# Patient Record
Sex: Male | Born: 1990 | Race: Black or African American | Hispanic: No | Marital: Single | Smoking: Current every day smoker
Health system: Southern US, Community
[De-identification: ages and names within clinical notes are randomized; demographics above are authoritative.]

---

## 2009-11-10 ENCOUNTER — Emergency Department (HOSPITAL_COMMUNITY): Admission: EM | Admit: 2009-11-10 | Discharge: 2009-11-10 | Payer: Self-pay | Admitting: Emergency Medicine

## 2009-11-18 ENCOUNTER — Emergency Department (HOSPITAL_COMMUNITY): Admission: EM | Admit: 2009-11-18 | Discharge: 2009-11-18 | Payer: Self-pay | Admitting: Family Medicine

## 2010-01-09 ENCOUNTER — Emergency Department (HOSPITAL_COMMUNITY): Admission: EM | Admit: 2010-01-09 | Discharge: 2010-01-09 | Payer: Self-pay | Admitting: Emergency Medicine

## 2010-05-17 ENCOUNTER — Emergency Department (HOSPITAL_COMMUNITY)
Admission: EM | Admit: 2010-05-17 | Discharge: 2010-05-17 | Payer: Self-pay | Source: Home / Self Care | Admitting: Emergency Medicine

## 2011-09-26 ENCOUNTER — Emergency Department (HOSPITAL_COMMUNITY)
Admission: EM | Admit: 2011-09-26 | Discharge: 2011-09-26 | Disposition: A | Discharge status: Home / Self Care | Payer: Self-pay | Attending: Emergency Medicine | Admitting: Emergency Medicine

## 2011-09-26 ENCOUNTER — Emergency Department (HOSPITAL_COMMUNITY): Admission: EM | Admit: 2011-09-26 | Payer: Self-pay | Source: Home / Self Care

## 2011-09-26 ENCOUNTER — Encounter (HOSPITAL_COMMUNITY): Payer: Self-pay | Admitting: *Deleted

## 2011-09-26 DIAGNOSIS — M2550 Pain in unspecified joint: Secondary | ICD-10-CM | POA: Insufficient documentation

## 2011-09-26 DIAGNOSIS — M79609 Pain in unspecified limb: Secondary | ICD-10-CM | POA: Insufficient documentation

## 2011-09-26 DIAGNOSIS — R6884 Jaw pain: Secondary | ICD-10-CM | POA: Insufficient documentation

## 2011-09-26 DIAGNOSIS — K089 Disorder of teeth and supporting structures, unspecified: Secondary | ICD-10-CM | POA: Insufficient documentation

## 2011-09-26 DIAGNOSIS — K0381 Cracked tooth: Secondary | ICD-10-CM | POA: Insufficient documentation

## 2011-09-26 DIAGNOSIS — K0889 Other specified disorders of teeth and supporting structures: Secondary | ICD-10-CM

## 2011-09-26 DIAGNOSIS — M79676 Pain in unspecified toe(s): Secondary | ICD-10-CM

## 2011-09-26 DIAGNOSIS — K029 Dental caries, unspecified: Secondary | ICD-10-CM | POA: Insufficient documentation

## 2011-09-26 MED ORDER — NAPROXEN 500 MG PO TABS: 500.0000 mg | ORAL_TABLET | Freq: Two times a day (BID) | ORAL | Status: DC

## 2011-09-26 NOTE — ED Provider Notes (Signed)
History     CSN: [id number]  Arrival date & time 09/26/11  1517   First MD Initiated Contact with Patient 09/26/11 1557      No chief complaint on file.   (Consider location/radiation/quality/duration/timing/severity/associated sxs/prior treatment) HPI Comments: Patient presents with 2 complaints. The patient's first complaint is that the fourth toe on his right foot is painful when he is wearing shoes. When he works his toe hurts. When he takes his shoes off the toe is not painful. Patient also complains of recurrent right mandibular jaw pain. Patient has some broken teeth and has had worsening pain in his second molar since last night. Tooth hurts mostly at night and when he drinks cold liquids. No fevers or trouble breathing.  Patient is a 21 y.o. male presenting with tooth pain. The history is provided by the patient.  Dental PainPrimary symptoms do not include dental injury, headaches, fever, shortness of breath, sore throat or cough. The symptoms began yesterday. The symptoms are unchanged. The symptoms are recurrent.  Additional symptoms do not include: gum swelling, gum tenderness, jaw pain, facial swelling, trouble swallowing and ear pain.    No past medical history on file.  No past surgical history on file.  No family history on file.  History  Substance Use Topics  . Smoking status: Not on file  . Smokeless tobacco: Not on file  . Alcohol Use: Not on file      Review of Systems  Constitutional: Negative for fever and activity change.  HENT: Positive for dental problem. Negative for ear pain, sore throat, facial swelling, trouble swallowing and neck pain.   Respiratory: Negative for cough, shortness of breath and stridor.   Musculoskeletal: Positive for arthralgias. Negative for back pain, joint swelling and gait problem.  Skin: Negative for color change and wound.  Neurological: Negative for weakness, numbness and headaches.    Allergies  Review of patient's  allergies indicates no known allergies.  Home Medications   Current Outpatient Rx  Name Route Sig Dispense Refill  . ACETAMINOPHEN 500 MG PO TABS Oral Take 500 mg by mouth every 6 (six) hours as needed. As needed for pain    . NAPROXEN 500 MG PO TABS Oral Take 1 tablet (500 mg total) by mouth 2 (two) times daily. 20 tablet 0    There were no vitals taken for this visit.  Physical Exam  Nursing note and vitals reviewed. Constitutional: He is oriented to person, place, and time. He appears well-developed and well-nourished.  HENT:  Head: Normocephalic and atraumatic. No trismus in the jaw.  Right Ear: Tympanic membrane, external ear and ear canal normal.  Left Ear: Tympanic membrane, external ear and ear canal normal.  Nose: Nose normal.  Mouth/Throat: Uvula is midline, oropharynx is clear and moist and mucous membranes are normal. Abnormal dentition. Dental caries present. No dental abscesses or uvula swelling. No tonsillar abscesses.         No swelling or erythema noted surrounding tooth on exam.  Eyes: Conjunctivae are normal. Pupils are equal, round, and reactive to light.  Neck: Normal range of motion. Neck supple.       No neck swelling or Lugwig's angina  Cardiovascular: Normal pulses.   Musculoskeletal: He exhibits tenderness. He exhibits no edema.       No tenderness or abnormality of toes on right foot. Toes are close together per patient's anatomy. No cellulitis or fungal infection noted. Normal cap refill.  Neurological: He is alert and oriented to  person, place, and time. No sensory deficit.       Motor, sensation, and vascular distal to the injury is fully intact.   Skin: Skin is warm and dry.  Psychiatric: He has a normal mood and affect.    ED Course  Procedures (including critical care time)  Labs Reviewed - No data to display No results found.   1. Pain, dental   2. Toe pain    4:22 PM Patient seen and examined.   4:23 PM Patient counseled to take  prescribed medications as directed, return with worsening facial or neck swelling, and to follow-up with his dentist as soon as possible.   Patient urged to find shoes that have a wider toe box and rare. He can use anti-inflammatories as needed for pain.   Patient verbalizes understanding and agrees with plan.  MDM  Patient with toothache.  No gross abscess.  Exam unconcerning for Ludwig's angina or other deep tissue infection in neck.  Will treat with NSAIDs.  Urged patient to follow-up with dentist. Toe pain to be treated with better fitting shoes and NSAIDs.           Eustace Moore [place], [place] 09/26/11 1624

## 2011-09-26 NOTE — Discharge Instructions (Signed)
Read and follow directions below.  As we discussed, you need to see a dentist as soon as possible for this problem.  Take naprosyn as directed with food.   If using the prescribed pain medication, do not use over-the-counter Tylenol or acetaminophen.  Do not drive, drink alcohol, or do any dangerous activities while taking the pain medicine that was prescribed.       Please return if symptoms worsen, you develop a fever, you develop more swelling in your face or neck, you have trouble breathing or swallowing food, or you feel it is necessary for any reason.    For your toe pain, you need to wear shoes that are wider in the area where your toes are.   The prescribed pain medication also should help.  RESOURCE GUIDE  Dental Problems  Patients with Medicaid: [facility] [phone] [address]                                           [phone] [address] Phone:  [phone]                                                  Phone:  [phone]  If unable to pay or uninsured, contact:  [facility] or [facility]. to become qualified for the adult dental clinic.  Chronic Pain Problems Contact [facility]  [phone] Patients need to be referred by their primary care doctor.  Insufficient Money for Medicine Contact [employer] Way:  call "211" or [facility] [phone].  No Primary Care Doctor Call [facility]  [phone] Other agencies that provide inexpensive medical care    [facility]  [phone]    [facility]  [phone]    [facility]  [phone]    [facility]  [phone]    [facility]  [phone]    [facility]  [phone]  Psychological Services [facility]  [phone] [facility] Services  [phone] [facility] Mental Health   [phone] (emergency services [phone])  Substance Abuse Resources [facility]   [phone] [facility] [phone] [facility] [place] [phone] Floydene Flock [phone] Residential & Outpatient Substance Abuse Program  [phone]  Abuse/Neglect [facility] Child Abuse Hotline 803-553-2424 [facility] Child Abuse Hotline 318-819-3981 (After Hours)  Emergency Shelter [facility] Ministries [phone]  Maternity Homes Room at the [place] [facility] [place] [phone] [facility] [phone]  MRSA Hotline #:   8725351236    [facility] Resources  [facility]     United Way                          [facility] Dept. [address] [place]                       [address]      [address]  [address]  Sela Hua Phone:  [phone]                                   Phone:  [phone]                 Phone:  [place] Phone:  [place] [phone] (After Hours)

## 2011-09-26 NOTE — ED Provider Notes (Signed)
Medical screening examination/treatment/procedure(s) were performed by non-physician practitioner and as supervising physician I was immediately available for consultation/collaboration.  Toy Baker, MD 09/26/11 2308

## 2011-09-26 NOTE — ED Notes (Signed)
Pt reports R lower toothache and R foot pain.  Denies injury to R foot.  Pt reports R foot has been hurting him x 2 months.

## 2011-11-30 ENCOUNTER — Encounter (HOSPITAL_COMMUNITY): Payer: Self-pay | Admitting: Family Medicine

## 2011-11-30 ENCOUNTER — Emergency Department (HOSPITAL_COMMUNITY)
Admission: EM | Admit: 2011-11-30 | Discharge: 2011-12-01 | Disposition: A | Discharge status: Home / Self Care | Payer: Managed Care, Other (non HMO) | Attending: Emergency Medicine | Admitting: Emergency Medicine

## 2011-11-30 ENCOUNTER — Emergency Department (HOSPITAL_COMMUNITY)
Admission: EM | Admit: 2011-11-30 | Discharge: 2011-11-30 | Discharge status: Against Medical Advice | Payer: Managed Care, Other (non HMO) | Attending: Emergency Medicine | Admitting: Emergency Medicine

## 2011-11-30 ENCOUNTER — Encounter (HOSPITAL_COMMUNITY): Payer: Self-pay | Admitting: *Deleted

## 2011-11-30 DIAGNOSIS — R112 Nausea with vomiting, unspecified: Secondary | ICD-10-CM | POA: Insufficient documentation

## 2011-11-30 DIAGNOSIS — R1110 Vomiting, unspecified: Secondary | ICD-10-CM | POA: Insufficient documentation

## 2011-11-30 DIAGNOSIS — B349 Viral infection, unspecified: Secondary | ICD-10-CM

## 2011-11-30 DIAGNOSIS — R42 Dizziness and giddiness: Secondary | ICD-10-CM | POA: Insufficient documentation

## 2011-11-30 DIAGNOSIS — R197 Diarrhea, unspecified: Secondary | ICD-10-CM | POA: Insufficient documentation

## 2011-11-30 DIAGNOSIS — B9789 Other viral agents as the cause of diseases classified elsewhere: Secondary | ICD-10-CM | POA: Insufficient documentation

## 2011-11-30 DIAGNOSIS — J45909 Unspecified asthma, uncomplicated: Secondary | ICD-10-CM | POA: Insufficient documentation

## 2011-11-30 NOTE — ED Notes (Signed)
Patient states he has n/v/d and dizziness since Monday. Also reports decreased appetite. Last vomited around noon. Last ate 30 minutes prior to arrival and has kept that down. Able to keep liquids down.

## 2011-12-01 LAB — URINALYSIS, ROUTINE W REFLEX MICROSCOPIC
Bilirubin Urine: NEGATIVE
Leukocytes, UA: NEGATIVE
Nitrite: NEGATIVE
Protein, ur: NEGATIVE mg/dL
pH: 7 (ref 5.0–8.0)

## 2011-12-01 LAB — POCT I-STAT, CHEM 8
Creatinine, Ser: 1 mg/dL (ref 0.50–1.35)
Hemoglobin: 15 g/dL (ref 13.0–17.0)
Potassium: 3.8 mEq/L (ref 3.5–5.1)
Sodium: 140 mEq/L (ref 135–145)
TCO2: 24 mmol/L (ref 0–100)

## 2011-12-01 MED ORDER — ONDANSETRON HCL 4 MG/2ML IJ SOLN
4.0000 mg | Freq: Once | INTRAMUSCULAR | Status: AC
  Administered 2011-12-01: 4 mg via INTRAVENOUS
  Filled 2011-12-01: qty 2

## 2011-12-01 MED ORDER — SODIUM CHLORIDE 0.9 % IV BOLUS (SEPSIS)
1000.0000 mL | Freq: Once | INTRAVENOUS | Status: AC
  Administered 2011-12-01: 1000 mL via INTRAVENOUS

## 2011-12-01 MED ORDER — ONDANSETRON 8 MG PO TBDP: ORAL_TABLET | ORAL | Status: AC

## 2011-12-01 NOTE — Discharge Instructions (Signed)
Continue to get plenty of rest. Drink plenty of fluids to stay hydrated. Use Tylenol or ibuprofen for body aches and fever. Return to the emergency room if you have any worsening symptoms or persistent nausea vomiting. Please followup with the primary care provider.   Viral Syndrome You or your child has Viral Syndrome. It is the most common infection causing "colds" and infections in the nose, throat, sinuses, and breathing tubes. Sometimes the infection causes nausea, vomiting, or diarrhea. The germ that causes the infection is a virus. No antibiotic or other medicine will kill it. There are medicines that you can take to make you or your child more comfortable.  HOME CARE INSTRUCTIONS   Rest in bed until you start to feel better.   If you have diarrhea or vomiting, eat small amounts of crackers and toast. Soup is helpful.   Do not give aspirin or medicine that contains aspirin to children.   Only take over-the-counter or prescription medicines for pain, discomfort, or fever as directed by your caregiver.  SEEK IMMEDIATE MEDICAL CARE IF:   You or your child has not improved within one week.   You or your child has pain that is not at least partially relieved by over-the-counter medicine.   Thick, colored mucus or blood is coughed up.   Discharge from the nose becomes thick yellow or green.   Diarrhea or vomiting gets worse.   There is any major change in your or your child's condition.   You or your child develops a skin rash, stiff neck, severe headache, or are unable to hold down food or fluid.   You or your child has an oral temperature above 102 F (38.9 C), not controlled by medicine.   Your baby is older than 3 months with a rectal temperature of 102 F (38.9 C) or higher.   Your baby is 23 months old or younger with a rectal temperature of 100.4 F (38 C) or higher.  Document Released: 07/10/2006 Document Revised: 07/14/2011 Document Reviewed: 07/11/2007 [facility]  Patient Information 2012 [place], [place].    RESOURCE GUIDE  Dental Problems  Patients with Medicaid: [facility] [phone] [address]                                           [phone] [address] Phone:  [phone]                                                  Phone:  [phone]  If unable to pay or uninsured, contact:  [facility] or [facility]. to become qualified for the adult dental clinic.  Chronic Pain Problems Contact [facility]  [phone] Patients need to be referred by their primary care doctor.  Insufficient Money for Medicine Contact United Way:  call "211" or [facility] [phone].  No Primary Care Doctor Call [facility]  [phone] Other agencies that provide inexpensive medical care    [facility]  [phone]    [facility]  [phone]    [facility]  [phone]    Women's Clinic  [phone]    [facility]  [phone]    [facility]  [phone]  Psychological Services [facility] Behavioral Health  [phone] [facility]  [phone] [facility] Mental Health   [phone] (emergency services [phone])  Substance Abuse Resources Alcohol and Drug Services  [phone] [facility] [phone] [facility] [place] [phone] Floydene Flock [phone] Residential & Outpatient Substance Abuse Program  [phone]  Abuse/Neglect [facility] Child Abuse Hotline 6360228086 [facility] Child Abuse Hotline (743)150-1175 (After Hours)  Emergency Shelter [facility] Ministries [phone]  Maternity Homes Room at the [place] [facility] [place] [phone] [facility] [phone]  MRSA Hotline #:   323 647 9535    [facility] Resources  [facility] [place]     [employer]                          [facility] Dept. [address]  [place]                       [address]      [address]  Blondell Reveal Phone:  [phone]                                   Phone:  [phone]                 Phone:  [phone]  [facility] Mental Health Phone:  [phone]  [facility] Child Abuse Hotline 825-682-9018 [phone] (After Hours)

## 2011-12-01 NOTE — ED Provider Notes (Signed)
Medical screening examination/treatment/procedure(s) were performed by non-physician practitioner and as supervising physician I was immediately available for consultation/collaboration.   Forbes Cellar, MD 12/01/11 [phone]

## 2011-12-01 NOTE — ED Notes (Signed)
Pt denies any nausea complaints

## 2011-12-01 NOTE — ED Provider Notes (Signed)
History     CSN: [id number]  Arrival date & time 11/30/11  2159   First MD Initiated Contact with Patient 12/01/11 0046      Chief Complaint  Patient presents with  . Dizziness  . Emesis  . Diarrhea    HPI  History provided by the patient. Patient is a healthy 21 year old male with history of asthma presents with complaints of nausea vomiting and diarrhea for the past 2-3 days. Symptoms began acutely and have been waxing and waning. Patient reports having up to 4 episodes of soft watery stools without blood or mucus today. Patient also has occasional nausea vomiting that seems worse with trying to keep foods. Patient has been able to keep down fluids. Symptoms are described as moderate. Symptoms are also associated with some lightheadedness with standing, bodyaches and chills. Patient denies any fever. Patient denies any syncope. Patient is unsure of any known sick contacts. Patient denies any other aggravating or alleviating factors.     Past Medical History  Diagnosis Date  . Asthma     History reviewed. No pertinent past surgical history.  No family history on file.  History  Substance Use Topics  . Smoking status: Current Everyday Smoker -- 0.5 packs/day    Types: Cigars  . Smokeless tobacco: Not on file  . Alcohol Use: Yes     Occasional      Review of Systems  Constitutional: Positive for chills and appetite change. Negative for fever.  HENT: Negative for congestion, sore throat and rhinorrhea.   Respiratory: Positive for cough. Negative for shortness of breath and wheezing.   Gastrointestinal: Positive for nausea, vomiting and diarrhea. Negative for abdominal pain and constipation.  Genitourinary: Negative for dysuria, frequency, hematuria and flank pain.  Musculoskeletal: Positive for myalgias.  Skin: Negative for rash.    Allergies  Augmentin and Penicillins  Home Medications   Current Outpatient Rx  Name Route Sig Dispense Refill  . ACETAMINOPHEN  500 MG PO TABS Oral Take 1,000 mg by mouth every 6 (six) hours as needed. Pain relief    . DIPHENHYDRAMINE HCL 25 MG PO TABS Oral Take 50 mg by mouth every 6 (six) hours as needed. For allergies.      BP 120/70  Pulse 68  Temp 98.3 F (36.8 C)  Resp 20  Wt 142 lb 8 oz (64.638 kg)  SpO2 100%  Physical Exam  Nursing note and vitals reviewed. Constitutional: He is oriented to person, place, and time. He appears well-developed and well-nourished. No distress.  HENT:  Head: Normocephalic and atraumatic.  Mouth/Throat: Oropharynx is clear and moist.  Neck: Normal range of motion. Neck supple.       No meningeal sign  Cardiovascular: Normal rate and regular rhythm.   Pulmonary/Chest: Effort normal. No respiratory distress. He has wheezes. He has no rales.  Abdominal: Soft. He exhibits no distension. There is no tenderness.  Neurological: He is alert and oriented to person, place, and time.  Skin: Skin is warm.  Psychiatric: He has a normal mood and affect. His behavior is normal.    ED Course  Procedures   Results for orders placed during the hospital encounter of 11/30/11  URINALYSIS, ROUTINE W REFLEX MICROSCOPIC      Component Value Range   Color, Urine YELLOW  YELLOW    APPearance CLOUDY (*) CLEAR    Specific Gravity, Urine 1.027  1.005 - 1.030    pH 7.0  5.0 - 8.0    Glucose, UA NEGATIVE  NEGATIVE (mg/dL)   Hgb urine dipstick NEGATIVE  NEGATIVE    Bilirubin Urine NEGATIVE  NEGATIVE    Ketones, ur TRACE (*) NEGATIVE (mg/dL)   Protein, ur NEGATIVE  NEGATIVE (mg/dL)   Urobilinogen, UA 1.0  0.0 - 1.0 (mg/dL)   Nitrite NEGATIVE  NEGATIVE    Leukocytes, UA NEGATIVE  NEGATIVE   POCT I-STAT, CHEM 8      Component Value Range   Sodium 140  135 - 145 (mEq/L)   Potassium 3.8  3.5 - 5.1 (mEq/L)   Chloride 104  96 - 112 (mEq/L)   BUN 10  6 - 23 (mg/dL)   Creatinine, Ser 1.61  0.50 - 1.35 (mg/dL)   Glucose, Bld 096 (*) 70 - 99 (mg/dL)   Calcium, Ion 0.45  4.09 - 1.32 (mmol/L)    TCO2 24  0 - 100 (mmol/L)   Hemoglobin 15.0  13.0 - 17.0 (g/dL)   HCT 81.1  91.4 - 78.2 (%)       1. Viral syndrome   2. Nausea vomiting and diarrhea       MDM  Patient seen and evaluated. Patient no acute distress. Patient is well-appearing and nontoxic.  Patient feeling better after IV fluids and Zofran. Patient is ready return home. Labs unremarkable.      Angus Seller, [place] 12/01/11 [phone]

## 2012-07-19 ENCOUNTER — Encounter (HOSPITAL_COMMUNITY): Payer: Self-pay | Admitting: Emergency Medicine

## 2012-07-19 ENCOUNTER — Emergency Department (HOSPITAL_COMMUNITY)
Admission: EM | Admit: 2012-07-19 | Discharge: 2012-07-19 | Disposition: A | Discharge status: Home / Self Care | Payer: Managed Care, Other (non HMO) | Attending: Emergency Medicine | Admitting: Emergency Medicine

## 2012-07-19 DIAGNOSIS — J45909 Unspecified asthma, uncomplicated: Secondary | ICD-10-CM | POA: Insufficient documentation

## 2012-07-19 DIAGNOSIS — Y929 Unspecified place or not applicable: Secondary | ICD-10-CM | POA: Insufficient documentation

## 2012-07-19 DIAGNOSIS — Y939 Activity, unspecified: Secondary | ICD-10-CM | POA: Insufficient documentation

## 2012-07-19 DIAGNOSIS — X58XXXA Exposure to other specified factors, initial encounter: Secondary | ICD-10-CM | POA: Insufficient documentation

## 2012-07-19 DIAGNOSIS — T148XXA Other injury of unspecified body region, initial encounter: Secondary | ICD-10-CM

## 2012-07-19 DIAGNOSIS — F17200 Nicotine dependence, unspecified, uncomplicated: Secondary | ICD-10-CM | POA: Insufficient documentation

## 2012-07-19 DIAGNOSIS — S239XXA Sprain of unspecified parts of thorax, initial encounter: Secondary | ICD-10-CM | POA: Insufficient documentation

## 2012-07-19 DIAGNOSIS — M549 Dorsalgia, unspecified: Secondary | ICD-10-CM

## 2012-07-19 DIAGNOSIS — M546 Pain in thoracic spine: Secondary | ICD-10-CM | POA: Insufficient documentation

## 2012-07-19 MED ORDER — IBUPROFEN 800 MG PO TABS
800.0000 mg | ORAL_TABLET | Freq: Once | ORAL | Status: AC
  Administered 2012-07-19: 800 mg via ORAL
  Filled 2012-07-19: qty 1

## 2012-07-19 MED ORDER — CYCLOBENZAPRINE HCL 10 MG PO TABS
10.0000 mg | ORAL_TABLET | Freq: Once | ORAL | Status: AC
  Administered 2012-07-19: 10 mg via ORAL
  Filled 2012-07-19: qty 1

## 2012-07-19 MED ORDER — HYDROCODONE-ACETAMINOPHEN 5-325 MG PO TABS: 1.0000 | ORAL_TABLET | ORAL | Status: AC | PRN

## 2012-07-19 MED ORDER — CYCLOBENZAPRINE HCL 10 MG PO TABS: 10.0000 mg | ORAL_TABLET | Freq: Two times a day (BID) | ORAL | Status: AC | PRN

## 2012-07-19 MED ORDER — IBUPROFEN 800 MG PO TABS: 800.0000 mg | ORAL_TABLET | Freq: Three times a day (TID) | ORAL | Status: AC

## 2012-07-19 NOTE — ED Notes (Signed)
Patient c/o upper and mid back pain since last Wednesday.  Patient hasn't been able to sleep.  Movement makes pain worse.  No other associated symptoms reported.

## 2012-07-19 NOTE — ED Provider Notes (Signed)
History     CSN: [id number]  Arrival date & time 07/19/12  1317   First MD Initiated Contact with Patient 07/19/12 1409      Chief Complaint  Patient presents with  . Back Pain    (Consider location/radiation/quality/duration/timing/severity/associated sxs/prior treatment) Patient is a 21 y.o. male presenting with back pain. The history is provided by the patient.  Back Pain  This is a new problem. The current episode started more than 1 week ago. The problem occurs constantly. The problem has been gradually worsening. Associated with: He does heavy lifting at work but no known specific injury. The pain is present in the thoracic spine. The pain is moderate. The symptoms are aggravated by bending, twisting and certain positions. Pertinent negatives include no chest pain, no fever and no abdominal pain. Associated symptoms comments: No SOB, chest pain, cough or fever. His pain is worse with movement and better with rest. .    Past Medical History  Diagnosis Date  . Asthma     History reviewed. No pertinent past surgical history.  History reviewed. No pertinent family history.  History  Substance Use Topics  . Smoking status: Current Every Day Smoker -- 0.5 packs/day    Types: Cigars  . Smokeless tobacco: Not on file  . Alcohol Use: Yes     Comment: Occasional      Review of Systems  Constitutional: Negative for fever.  Respiratory: Negative for cough and shortness of breath.   Cardiovascular: Negative for chest pain.  Gastrointestinal: Negative for nausea and abdominal pain.  Musculoskeletal: Positive for back pain.  Skin: Negative for wound.    Allergies  Augmentin and Penicillins  Home Medications   Current Outpatient Rx  Name  Route  Sig  Dispense  Refill  . ACETAMINOPHEN 500 MG PO TABS   Oral   Take 1,000 mg by mouth every 6 (six) hours as needed. Pain relief         . IBUPROFEN 200 MG PO TABS   Oral   Take 800 mg by mouth every 6 (six) hours as  needed. Pain.           BP 109/70  Pulse 75  Temp 98.3 F (36.8 C) (Oral)  Resp 20  SpO2 100%  Physical Exam  Constitutional: He is oriented to person, place, and time. He appears well-developed and well-nourished.  Neck: Normal range of motion.  Pulmonary/Chest: Effort normal. He exhibits no tenderness.  Abdominal: There is no tenderness.  Musculoskeletal: Normal range of motion.       Reproducible tenderness to left upper back from paraspinal region to lateral border. No swelling or mass. FROM left upper extremity.  Neurological: He is alert and oriented to person, place, and time.  Skin: Skin is warm and dry.  Psychiatric: He has a normal mood and affect.    ED Course  Procedures (including critical care time)  Labs Reviewed - No data to display No results found.   No diagnosis found.  1. Muscular back strain  MDM  Muscular back pain        Rodena Medin, PA-C 07/19/12 1419

## 2012-07-20 NOTE — ED Provider Notes (Signed)
Medical screening examination/treatment/procedure(s) were performed by non-physician practitioner and as supervising physician I was immediately available for consultation/collaboration.  Donnetta Hutching, MD 07/20/12 [phone]

## 2012-11-08 ENCOUNTER — Other Ambulatory Visit: Payer: Self-pay | Admitting: Family Medicine

## 2012-11-08 ENCOUNTER — Ambulatory Visit
Admission: RE | Admit: 2012-11-08 | Discharge: 2012-11-08 | Disposition: A | Discharge status: Home / Self Care | Payer: Managed Care, Other (non HMO) | Source: Ambulatory Visit | Attending: Family Medicine | Admitting: Family Medicine

## 2012-11-08 DIAGNOSIS — R52 Pain, unspecified: Secondary | ICD-10-CM

## 2014-05-12 ENCOUNTER — Emergency Department (HOSPITAL_COMMUNITY)
Admission: EM | Admit: 2014-05-12 | Discharge: 2014-05-12 | Disposition: A | Discharge status: Home / Self Care | Payer: BC Managed Care – PPO | Attending: Emergency Medicine | Admitting: Emergency Medicine

## 2014-05-12 ENCOUNTER — Encounter (HOSPITAL_COMMUNITY): Payer: Self-pay | Admitting: Emergency Medicine

## 2014-05-12 ENCOUNTER — Emergency Department (HOSPITAL_COMMUNITY): Payer: BC Managed Care – PPO

## 2014-05-12 DIAGNOSIS — X58XXXA Exposure to other specified factors, initial encounter: Secondary | ICD-10-CM | POA: Diagnosis not present

## 2014-05-12 DIAGNOSIS — Z880 Allergy status to penicillin: Secondary | ICD-10-CM | POA: Diagnosis not present

## 2014-05-12 DIAGNOSIS — Z7902 Long term (current) use of antithrombotics/antiplatelets: Secondary | ICD-10-CM | POA: Insufficient documentation

## 2014-05-12 DIAGNOSIS — S20309A Unspecified superficial injuries of unspecified front wall of thorax, initial encounter: Secondary | ICD-10-CM | POA: Diagnosis not present

## 2014-05-12 DIAGNOSIS — Y9389 Activity, other specified: Secondary | ICD-10-CM | POA: Insufficient documentation

## 2014-05-12 DIAGNOSIS — R0789 Other chest pain: Secondary | ICD-10-CM

## 2014-05-12 DIAGNOSIS — Z720 Tobacco use: Secondary | ICD-10-CM | POA: Insufficient documentation

## 2014-05-12 DIAGNOSIS — Y990 Civilian activity done for income or pay: Secondary | ICD-10-CM | POA: Insufficient documentation

## 2014-05-12 DIAGNOSIS — J45909 Unspecified asthma, uncomplicated: Secondary | ICD-10-CM | POA: Insufficient documentation

## 2014-05-12 MED ORDER — NAPROXEN 500 MG PO TABS
500.0000 mg | ORAL_TABLET | Freq: Two times a day (BID) | ORAL | Status: AC
Start: 2014-05-12 — End: ?

## 2014-05-12 NOTE — ED Notes (Signed)
Pt states about 1 week ago at work was lifting something heavy and felt chest [popped since then pt has been having chest pain, denies SOB n/v.

## 2014-05-12 NOTE — Discharge Instructions (Signed)
Take the prescribed medication as directed. °Return to the ED for new or worsening symptoms. ° °

## 2014-05-12 NOTE — ED Provider Notes (Signed)
CSN: [id number]     Arrival date & time 05/12/14  1045 History   First MD Initiated Contact with Patient 05/12/14 1101     Chief Complaint  Patient presents with  . Chest Pain     (Consider location/radiation/quality/duration/timing/severity/associated sxs/prior Treatment) Patient is a 23 y.o. male presenting with chest pain. The history is provided by the patient and medical records.  Chest Pain  This is a 23 year old male with past history significant for asthma, presented to the ED for chest pain. Patient states one week ago the ED was lifting something heavy at work and felt a "pop" in his chest. States since that time he's been having ongoing chest pain localized to top of his sternum without radiation. He denies any shortness of breath, palpitations, dizziness, weakness, nausea, vomiting.  Patient has no prior cardiac history. He is a daily smoker, approximately half pack per day.  No prior hx of DVT or PE.  VS stable on arrival.  Past Medical History  Diagnosis Date  . Asthma    History reviewed. No pertinent past surgical history. No family history on file. History  Substance Use Topics  . Smoking status: Current Every Day Smoker -- 0.50 packs/day    Types: Cigars  . Smokeless tobacco: Not on file  . Alcohol Use: Yes     Comment: Occasional    Review of Systems  Cardiovascular: Positive for chest pain.  All other systems reviewed and are negative.     Allergies  Augmentin and Penicillins  Home Medications   Prior to Admission medications   Medication Sig Start Date End Date Taking? Authorizing Provider  acetaminophen (TYLENOL) 500 MG tablet Take 1,000 mg by mouth every 6 (six) hours as needed. Pain relief    Historical Provider, MD  cyclobenzaprine (FLEXERIL) 10 MG tablet Take 1 tablet (10 mg total) by mouth 2 (two) times daily as needed for muscle spasms. 07/19/12   Shari A Upstill, PA-C  HYDROcodone-acetaminophen (NORCO/VICODIN) 5-325 MG per tablet Take 1  tablet by mouth every 4 (four) hours as needed for pain. 07/19/12   Shari A Upstill, PA-C  ibuprofen (ADVIL,MOTRIN) 200 MG tablet Take 800 mg by mouth every 6 (six) hours as needed. Pain.    Historical Provider, MD  ibuprofen (ADVIL,MOTRIN) 800 MG tablet Take 1 tablet (800 mg total) by mouth 3 (three) times daily. 07/19/12   Shari A Upstill, PA-C   BP 129/75  Pulse 78  Temp(Src) 98.3 F (36.8 C) (Oral)  Resp 13  SpO2 100%  Physical Exam  Nursing note and vitals reviewed. Constitutional: He is oriented to person, place, and time. He appears well-developed and well-nourished.  HENT:  Head: Normocephalic and atraumatic.  Mouth/Throat: Oropharynx is clear and moist.  Eyes: Conjunctivae and EOM are normal. Pupils are equal, round, and reactive to light.  Neck: Normal range of motion.  Cardiovascular: Normal rate, regular rhythm and normal heart sounds.   Pulmonary/Chest: Effort normal and breath sounds normal. No respiratory distress. He has no wheezes. He exhibits tenderness and bony tenderness.    Pain reproducible with palpation to top of sternum; no deformities noted; respirations unlabored; lungs clear bilaterally  Abdominal: Soft. Bowel sounds are normal.  Musculoskeletal: Normal range of motion.  Neurological: He is alert and oriented to person, place, and time.  Skin: Skin is warm and dry.  Psychiatric: He has a normal mood and affect.    ED Course  Procedures (including critical care time) Labs Review Labs Reviewed - No data  to display  Imaging Review Dg Chest 2 View  05/12/2014   CLINICAL DATA:  Occasional smoker with new mid chest pain and burning.  EXAM: CHEST  2 VIEW  COMPARISON:  None.  FINDINGS: Trachea is midline. Heart size normal. Probable vessel on end or calcified granuloma in the left suprahilar region. Lungs are otherwise clear. No pleural fluid.  IMPRESSION: No acute findings.   Electronically Signed   By: Leanna Battles M.D.   On: 05/12/2014 11:18     EKG  Interpretation   Date/Time:  Monday May 12 2014 10:50:22 EDT Ventricular Rate:  72 PR Interval:  145 QRS Duration: 97 QT Interval:  375 QTC Calculation: 410 R Axis:   63 Text Interpretation:  Sinus rhythm RSR' in V1 or V2, probably normal  variant Baseline wander in lead(s) III aVL aVF V3 Otherwise within normal  limits Confirmed by POLLINA  MD, CHRISTOPHER [phone]) on 05/12/2014 10:56:21  AM      MDM   Final diagnoses:  None   23 year old male with chest pain after lifting heavy object of work last week. On exam, pain is reproducible with palpation to the upper sternum. His respirations are unlabored and his vital signs are stable on room air.  EKG sinus rhythm without acute ischemic change. Chest x-ray is clear, no evidence of rib fracture or pneumothorax. Low suspicion for ACS, PE, dissection, or other acute cardiac event at this time. Patient will discharge home with supportive care.  Discussed plan with patient, he/she acknowledged understanding and agreed with plan of care.  Return precautions given for new or worsening symptoms.  Garlon Hatchet, PA-C 05/12/14 1217

## 2014-05-13 NOTE — ED Provider Notes (Signed)
Medical screening examination/treatment/procedure(s) were performed by non-physician practitioner and as supervising physician I was immediately available for consultation/collaboration.   Christopher J. Pollina, MD 05/13/14 0715 

## 2015-05-15 ENCOUNTER — Emergency Department (HOSPITAL_COMMUNITY)
Admission: EM | Admit: 2015-05-15 | Discharge: 2015-05-16 | Discharge status: Against Medical Advice | Payer: BLUE CROSS/BLUE SHIELD | Attending: Emergency Medicine | Admitting: Emergency Medicine

## 2015-05-15 ENCOUNTER — Encounter (HOSPITAL_COMMUNITY): Payer: Self-pay | Admitting: Emergency Medicine

## 2015-05-15 DIAGNOSIS — J45909 Unspecified asthma, uncomplicated: Secondary | ICD-10-CM | POA: Diagnosis not present

## 2015-05-15 DIAGNOSIS — K0889 Other specified disorders of teeth and supporting structures: Secondary | ICD-10-CM

## 2015-05-15 DIAGNOSIS — Z720 Tobacco use: Secondary | ICD-10-CM | POA: Insufficient documentation

## 2015-05-15 NOTE — ED Notes (Signed)
Pt from home c/o left lower side dental pain that has been ongoing. Pt reports taking ibuprofen without relief. Pt has no dentist.

## 2015-05-15 NOTE — ED Notes (Signed)
Attempted to call pt x 3. No answer

## 2015-05-17 ENCOUNTER — Emergency Department (HOSPITAL_COMMUNITY)
Admission: EM | Admit: 2015-05-17 | Discharge: 2015-05-17 | Disposition: A | Discharge status: Home / Self Care | Payer: Managed Care, Other (non HMO) | Attending: Emergency Medicine | Admitting: Emergency Medicine

## 2015-05-17 ENCOUNTER — Encounter (HOSPITAL_COMMUNITY): Payer: Self-pay | Admitting: Emergency Medicine

## 2015-05-17 DIAGNOSIS — Z720 Tobacco use: Secondary | ICD-10-CM | POA: Insufficient documentation

## 2015-05-17 DIAGNOSIS — K029 Dental caries, unspecified: Secondary | ICD-10-CM | POA: Insufficient documentation

## 2015-05-17 DIAGNOSIS — J45909 Unspecified asthma, uncomplicated: Secondary | ICD-10-CM | POA: Insufficient documentation

## 2015-05-17 DIAGNOSIS — K002 Abnormalities of size and form of teeth: Secondary | ICD-10-CM | POA: Insufficient documentation

## 2015-05-17 DIAGNOSIS — Z791 Long term (current) use of non-steroidal anti-inflammatories (NSAID): Secondary | ICD-10-CM | POA: Insufficient documentation

## 2015-05-17 MED ORDER — CLINDAMYCIN HCL 150 MG PO CAPS: 150.0000 mg | ORAL_CAPSULE | Freq: Four times a day (QID) | ORAL | Status: AC

## 2015-05-17 NOTE — ED Notes (Signed)
Pt c/o left posterior dental pain that started on Friday.  Pt states that he has broken tooth and taken ibuprofen for pain.

## 2015-05-17 NOTE — Discharge Instructions (Signed)
Follow-up with your dentist, or with the dentist that we referred tomorrow for further management of your teeth. Continue to take ibuprofen and take antibiotic to help decrease the infection.  Dental Pain Dental pain may be caused by many things, including:  Tooth decay (cavities or caries). Cavities expose the nerve of your tooth to air and hot or cold temperatures. This can cause pain or discomfort.  Abscess or infection. A dental abscess is a collection of infected pus from a bacterial infection in the inner part of the tooth (pulp). It usually occurs at the end of the tooth's root.  Injury.  An unknown reason (idiopathic). Your pain may be mild or severe. It may only occur when:  You are chewing.  You are exposed to hot or cold temperature.  You are eating or drinking sugary foods or beverages, such as soda or candy. Your pain may also be constant. HOME CARE INSTRUCTIONS Watch your dental pain for any changes. The following actions may help to lessen any discomfort that you are feeling:  Take medicines only as directed by your dentist.  If you were prescribed an antibiotic medicine, finish all of it even if you start to feel better.  Keep all follow-up visits as directed by your dentist. This is important.  Do not apply heat to the outside of your face.  Rinse your mouth or gargle with salt water if directed by your dentist. This helps with pain and swelling.  You can make salt water by adding  tsp of salt to 1 cup of warm water.  Apply ice to the painful area of your face:  Put ice in a plastic bag.  Place a towel between your skin and the bag.  Leave the ice on for 20 minutes, 2-3 times per day.  Avoid foods or drinks that cause you pain, such as:  Very hot or very cold foods or drinks.  Sweet or sugary foods or drinks. SEEK MEDICAL CARE IF:  Your pain is not controlled with medicines.  Your symptoms are worse.  You have new symptoms. SEEK IMMEDIATE  MEDICAL CARE IF:  You are unable to open your mouth.  You are having trouble breathing or swallowing.  You have a fever.  Your face, neck, or jaw is swollen.   This information is not intended to replace advice given to you by your health care provider. Make sure you discuss any questions you have with your health care provider.   Document Released: 07/25/2005 Document Revised: 12/09/2014 Document Reviewed: 07/21/2014 Elsevier Interactive Patient Education [employer]

## 2015-05-17 NOTE — ED Provider Notes (Signed)
CSN: [id number]     Arrival date & time 05/17/15  1013 History   First MD Initiated Contact with Patient 05/17/15 1021     No chief complaint on file.    (Consider location/radiation/quality/duration/timing/severity/associated sxs/prior Treatment) HPI   24 year old male who presents for evaluation of dental pain. Patient complaining of intermittent left lower pain to his teeth ongoing for the past 2 days. Describe pain as a sharp throbbing sensation, moderate to severe in intensity, worsening with chewing and with temperature change. He has tried taking Tylenol and ibuprofen as well as Goody powders without adequate relief. No associated fever, neck pain, ear pain, trouble swallowing, or rash. He denies any recent trauma. 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  Past Medical History  Diagnosis Date  . Asthma    No past surgical history on file. No family history on file. Social History  Substance Use Topics  . Smoking status: Current Every Day Smoker -- 0.50 packs/day    Types: Cigars  . Smokeless tobacco: Not on file  . Alcohol Use: Yes     Comment: Occasional    Review of Systems  Constitutional: Negative for fever.  HENT: Positive for dental problem.   Skin: Negative for wound.      Allergies  Augmentin and Penicillins  Home Medications   Prior to Admission medications   Medication Sig Start Date End Date Taking? Authorizing Provider  acetaminophen (TYLENOL) 500 MG tablet Take 1,000 mg by mouth every 6 (six) hours as needed. Pain relief    Historical Provider, MD  cyclobenzaprine (FLEXERIL) 10 MG tablet Take 1 tablet (10 mg total) by mouth 2 (two) times daily as needed for muscle spasms. 07/19/12   Elpidio Anis, PA-C  HYDROcodone-acetaminophen (NORCO/VICODIN) 5-325 MG per tablet Take 1 tablet by mouth every 4 (four) hours as needed for pain. 07/19/12   Elpidio Anis, PA-C  ibuprofen (ADVIL,MOTRIN) 200 MG tablet Take 800 mg by mouth every 6  (six) hours as needed. Pain.    Historical Provider, MD  ibuprofen (ADVIL,MOTRIN) 800 MG tablet Take 1 tablet (800 mg total) by mouth 3 (three) times daily. 07/19/12   Elpidio Anis, PA-C  naproxen (NAPROSYN) 500 MG tablet Take 1 tablet (500 mg total) by mouth 2 (two) times daily with a meal. 05/12/14   Garlon Hatchet, PA-C   BP 123/68 mmHg  Pulse 77  Temp(Src) 98.1 F (36.7 C) (Oral)  Resp 18  SpO2 100% Physical Exam  Constitutional: He appears well-developed and well-nourished. No distress.  HENT:  Head: Atraumatic.  ENT: Dental cavities and poor oral dentition noted, pain along tooth #18 , midline uvula, no trismus, oropharynx moist and clear, no abscess noted, no oropharyngeal erythema or edema, neck supple and no tenderness. No facial edema   Eyes: Conjunctivae are normal.  Neck: Neck supple.  Neurological: He is alert.  Skin: No rash noted.  Psychiatric: He has a normal mood and affect.  Nursing note and vitals reviewed.   ED Course  Procedures (including critical care time)  Pt presents c/o dental pain. There is no evidence of abscess, no trismus, and the uvula is midline.  The patient will be given prescriptions for antibiotics and analgesics.  The patient was counseled to follow up with a dentist for further treatment.  The patient was informed to return to the ED if there is interval development of hoarseness, trismus, erythema, difficulty breathing, swelling of the neck or worsening pain. The patient  verbalized understanding of the plan and agrees.  The patient was provided the opportunity to ask questions.  All questions were answered.  MDM   Final diagnoses:  Pain due to dental caries    BP 123/68 mmHg  Pulse 77  Temp(Src) 98.1 F (36.7 C) (Oral)  Resp 18  SpO2 100%     Fayrene Helper, PA-C 05/17/15 1032  Melene Plan, DO 05/17/15 1048

## 2015-09-29 ENCOUNTER — Emergency Department (HOSPITAL_COMMUNITY): Payer: BLUE CROSS/BLUE SHIELD

## 2015-09-29 ENCOUNTER — Encounter (HOSPITAL_COMMUNITY): Payer: Self-pay | Admitting: Emergency Medicine

## 2015-09-29 ENCOUNTER — Emergency Department (HOSPITAL_COMMUNITY)
Admission: EM | Admit: 2015-09-29 | Discharge: 2015-09-29 | Disposition: A | Discharge status: Home / Self Care | Payer: BLUE CROSS/BLUE SHIELD | Attending: Emergency Medicine | Admitting: Emergency Medicine

## 2015-09-29 DIAGNOSIS — Z792 Long term (current) use of antibiotics: Secondary | ICD-10-CM | POA: Diagnosis not present

## 2015-09-29 DIAGNOSIS — B349 Viral infection, unspecified: Secondary | ICD-10-CM

## 2015-09-29 DIAGNOSIS — Z791 Long term (current) use of non-steroidal anti-inflammatories (NSAID): Secondary | ICD-10-CM | POA: Diagnosis not present

## 2015-09-29 DIAGNOSIS — J45901 Unspecified asthma with (acute) exacerbation: Secondary | ICD-10-CM | POA: Insufficient documentation

## 2015-09-29 DIAGNOSIS — Z880 Allergy status to penicillin: Secondary | ICD-10-CM | POA: Diagnosis not present

## 2015-09-29 DIAGNOSIS — F17210 Nicotine dependence, cigarettes, uncomplicated: Secondary | ICD-10-CM | POA: Insufficient documentation

## 2015-09-29 DIAGNOSIS — R05 Cough: Secondary | ICD-10-CM | POA: Diagnosis present

## 2015-09-29 MED ORDER — ALBUTEROL SULFATE HFA 108 (90 BASE) MCG/ACT IN AERS: 1.0000 | INHALATION_SPRAY | Freq: Four times a day (QID) | RESPIRATORY_TRACT | Status: AC | PRN

## 2015-09-29 NOTE — Discharge Instructions (Signed)
Please read and follow all provided instructions.  Your diagnoses today include:  1. Viral syndrome     You appear to have an upper respiratory infection (URI). An upper respiratory tract infection, or cold, is a viral infection of the air passages leading to the lungs. It should improve gradually after 5-7 days. You may have a lingering cough that lasts for 2- 4 weeks after the infection.  Tests performed today include:  Vital signs. See below for your results today.   Medications prescribed:   Take any prescribed medications only as directed. Treatment for your infection is aimed at treating the symptoms. There are no medications, such as antibiotics, that will cure your infection.   Home care instructions:  Follow any educational materials contained in this packet.   Your illness is contagious and can be spread to others, especially during the first 3 or 4 days. It cannot be cured by antibiotics or other medicines. Take basic precautions such as washing your hands often, covering your mouth when you cough or sneeze, and avoiding public places where you could spread your illness to others.   Please continue drinking plenty of fluids.  Use over-the-counter medicines as needed as directed on packaging for symptom relief.  You may also use ibuprofen or tylenol as directed on packaging for pain or fever.  Do not take multiple medicines containing Tylenol or acetaminophen to avoid taking too much of this medication.  Follow-up instructions: Please follow-up with your primary care provider in the next 3 days for further evaluation of your symptoms if you are not feeling better.   Return instructions:   Please return to the Emergency Department if you experience worsening symptoms.   RETURN IMMEDIATELY IF you develop shortness of breath, confusion or altered mental status, a new rash, become dizzy, faint, or poorly responsive, or are unable to be cared for at home.  Please return if you  have persistent vomiting and cannot keep down fluids or develop a fever that is not controlled by tylenol or motrin.    Please return if you have any other emergent concerns.  Additional Information:  Your vital signs today were: BP 116/73 mmHg   Pulse 75   Temp(Src) 98.3 F (36.8 C) (Oral)   Resp 18   SpO2 100% If your blood pressure (BP) was elevated above 135/85 this visit, please have this repeated by your doctor within one month. --------------

## 2015-09-29 NOTE — ED Notes (Signed)
Bed: WA30 Expected date:  Expected time:  Means of arrival:  Comments: 

## 2015-09-29 NOTE — ED Provider Notes (Signed)
CSN: [id number]     Arrival date & time 09/29/15  0900 History   First MD Initiated Contact with Patient 09/29/15 1028     Chief Complaint  Patient presents with  . URI   (Consider location/radiation/quality/duration/timing/severity/associated sxs/prior Treatment) HPI 25 y.o. male presents to the Emergency Department today complaining of URI symptoms since Friday. Notes sore throat, cough, SOB. Associated body aches. No rhinorrhea. Subjective fever on Friday, but none now. No N/V/D. No CP/ABD pain. No dysuria. Having regular BMs. States no pain. Has tried OTC relief with Nyquil with minimal relief. No other symptoms noted.      Past Medical History  Diagnosis Date  . Asthma    History reviewed. No pertinent past surgical history. No family history on file. Social History  Substance Use Topics  . Smoking status: Current Every Day Smoker -- 0.50 packs/day    Types: Cigars  . Smokeless tobacco: None  . Alcohol Use: Yes     Comment: Occasional    Review of Systems ROS reviewed and all are negative for acute change except as noted in the HPI.  Allergies  Augmentin and Penicillins  Home Medications   Prior to Admission medications   Medication Sig Start Date End Date Taking? Authorizing Provider  acetaminophen (TYLENOL) 500 MG tablet Take 1,000 mg by mouth every 6 (six) hours as needed. Pain relief    Historical Provider, MD  clindamycin (CLEOCIN) 150 MG capsule Take 1 capsule (150 mg total) by mouth every 6 (six) hours. 05/17/15   Fayrene Helper, PA-C  cyclobenzaprine (FLEXERIL) 10 MG tablet Take 1 tablet (10 mg total) by mouth 2 (two) times daily as needed for muscle spasms. 07/19/12   Elpidio Anis, PA-C  HYDROcodone-acetaminophen (NORCO/VICODIN) 5-325 MG per tablet Take 1 tablet by mouth every 4 (four) hours as needed for pain. 07/19/12   Elpidio Anis, PA-C  ibuprofen (ADVIL,MOTRIN) 200 MG tablet Take 800 mg by mouth every 6 (six) hours as needed. Pain.    Historical Provider, MD   ibuprofen (ADVIL,MOTRIN) 800 MG tablet Take 1 tablet (800 mg total) by mouth 3 (three) times daily. 07/19/12   Elpidio Anis, PA-C  naproxen (NAPROSYN) 500 MG tablet Take 1 tablet (500 mg total) by mouth 2 (two) times daily with a meal. 05/12/14   Garlon Hatchet, PA-C   BP 116/73 mmHg  Pulse 75  Temp(Src) 98.3 F (36.8 C) (Oral)  Resp 18  SpO2 100%   Physical Exam  Constitutional: He is oriented to person, place, and time. He appears well-developed and well-nourished.  HENT:  Head: Normocephalic and atraumatic.  Eyes: EOM are normal. Pupils are equal, round, and reactive to light.  Neck: Normal range of motion. Neck supple.  Cardiovascular: Normal rate, regular rhythm and normal heart sounds.   Pulmonary/Chest: Effort normal. He has rhonchi in the right upper field, the right lower field, the left upper field and the left lower field.  Abdominal: Soft. Normal appearance and bowel sounds are normal. There is no tenderness.  Musculoskeletal: Normal range of motion.  Neurological: He is alert and oriented to person, place, and time.  Skin: Skin is warm and dry.  Psychiatric: He has a normal mood and affect. His behavior is normal. Thought content normal.  Nursing note and vitals reviewed.  ED Course  Procedures (including critical care time) Labs Review Labs Reviewed - No data to display  Imaging Review Dg Chest 2 View  09/29/2015  CLINICAL DATA:  Back pain.  Cough. EXAM: CHEST  2 VIEW COMPARISON:  05/12/2014 FINDINGS: The heart size and mediastinal contours are within normal limits. Both lungs are clear. The visualized skeletal structures are unremarkable. IMPRESSION: No active cardiopulmonary disease. Electronically Signed   By: Charlett Nose M.D.   On: 09/29/2015 10:52   I have personally reviewed and evaluated these images and lab results as part of my medical decision-making.   EKG Interpretation None      MDM  I have reviewed relevant imaging studies. I have reviewed the  relevant previous healthcare records. I obtained HPI from historian. Patient discussed with supervising physician  ED Course:  Assessment: 24y M presents with URI symptoms. CXR negative for acute infiltrate. Patients symptoms are consistent with URI, likely viral etiology. Discussed that antibiotics are not indicated for viral infections. Pt will be discharged with symptomatic treatment.  Given Rx albuterol to last until PCP appointment. Verbalizes understanding and is agreeable with plan. Pt is hemodynamically stable & in NAD prior to dc.  Disposition/Plan:  DC Home Additional Verbal discharge instructions given and discussed with patient.  Pt Instructed to f/u with PCP in the next 48-72 hours for evaluation and treatment of symptoms. Return precautions given Pt acknowledges and agrees with plan  Supervising Physician Linwood Dibbles, MD   Final diagnoses:  Viral syndrome        Audry Pili, PA-C 09/29/15 1103  Linwood Dibbles, MD 09/30/15 [phone]

## 2015-09-29 NOTE — ED Notes (Signed)
Per pt, states flu like symptoms since Friday-congestion and bodyaches

## 2016-11-26 IMAGING — CR DG CHEST 2V
2 series · 2 of 2 positions shown · non-contrast
Comparison: 05/12/2014

CLINICAL DATA: Back pain.  Cough.

EXAM:
CHEST  2 VIEW

[w chest pa]
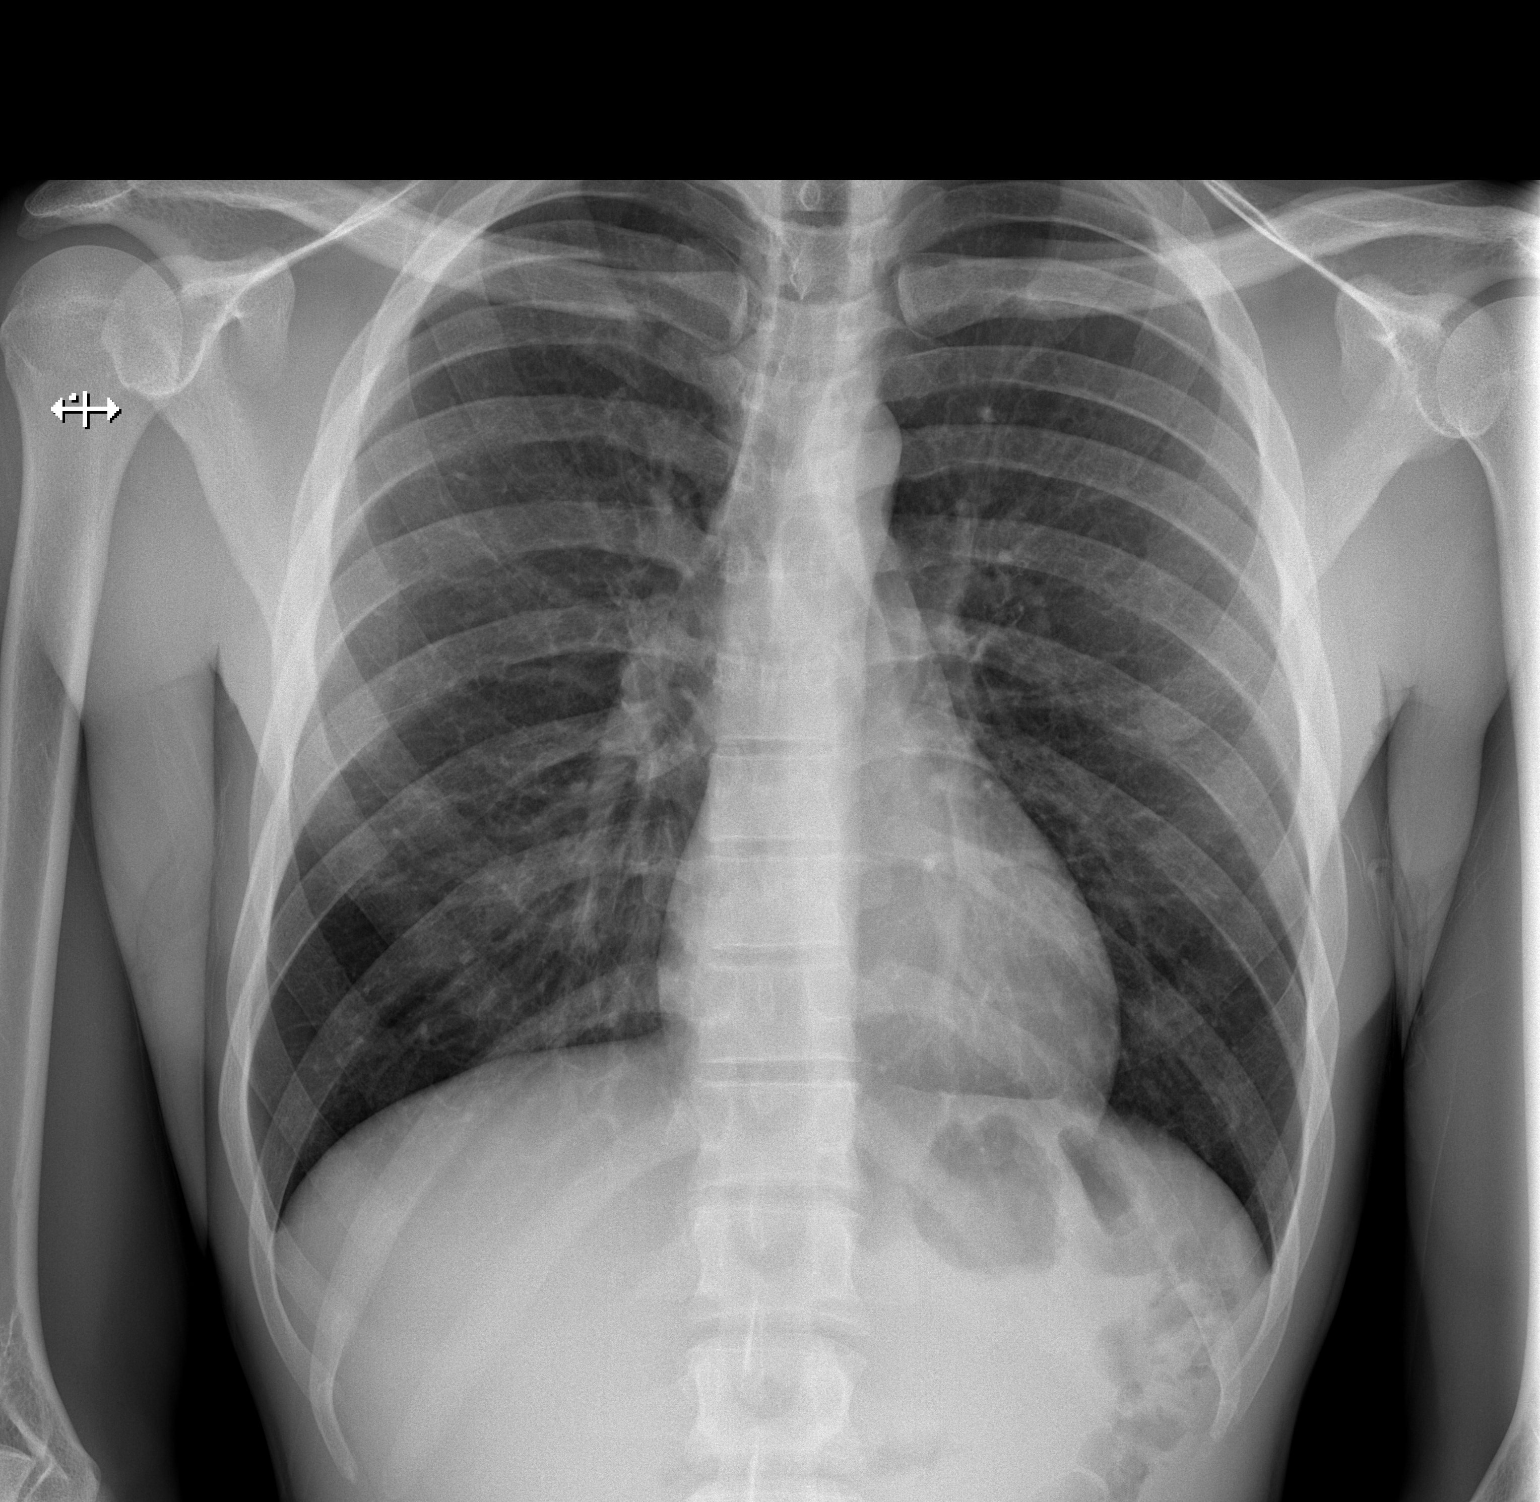

[w chest lat]
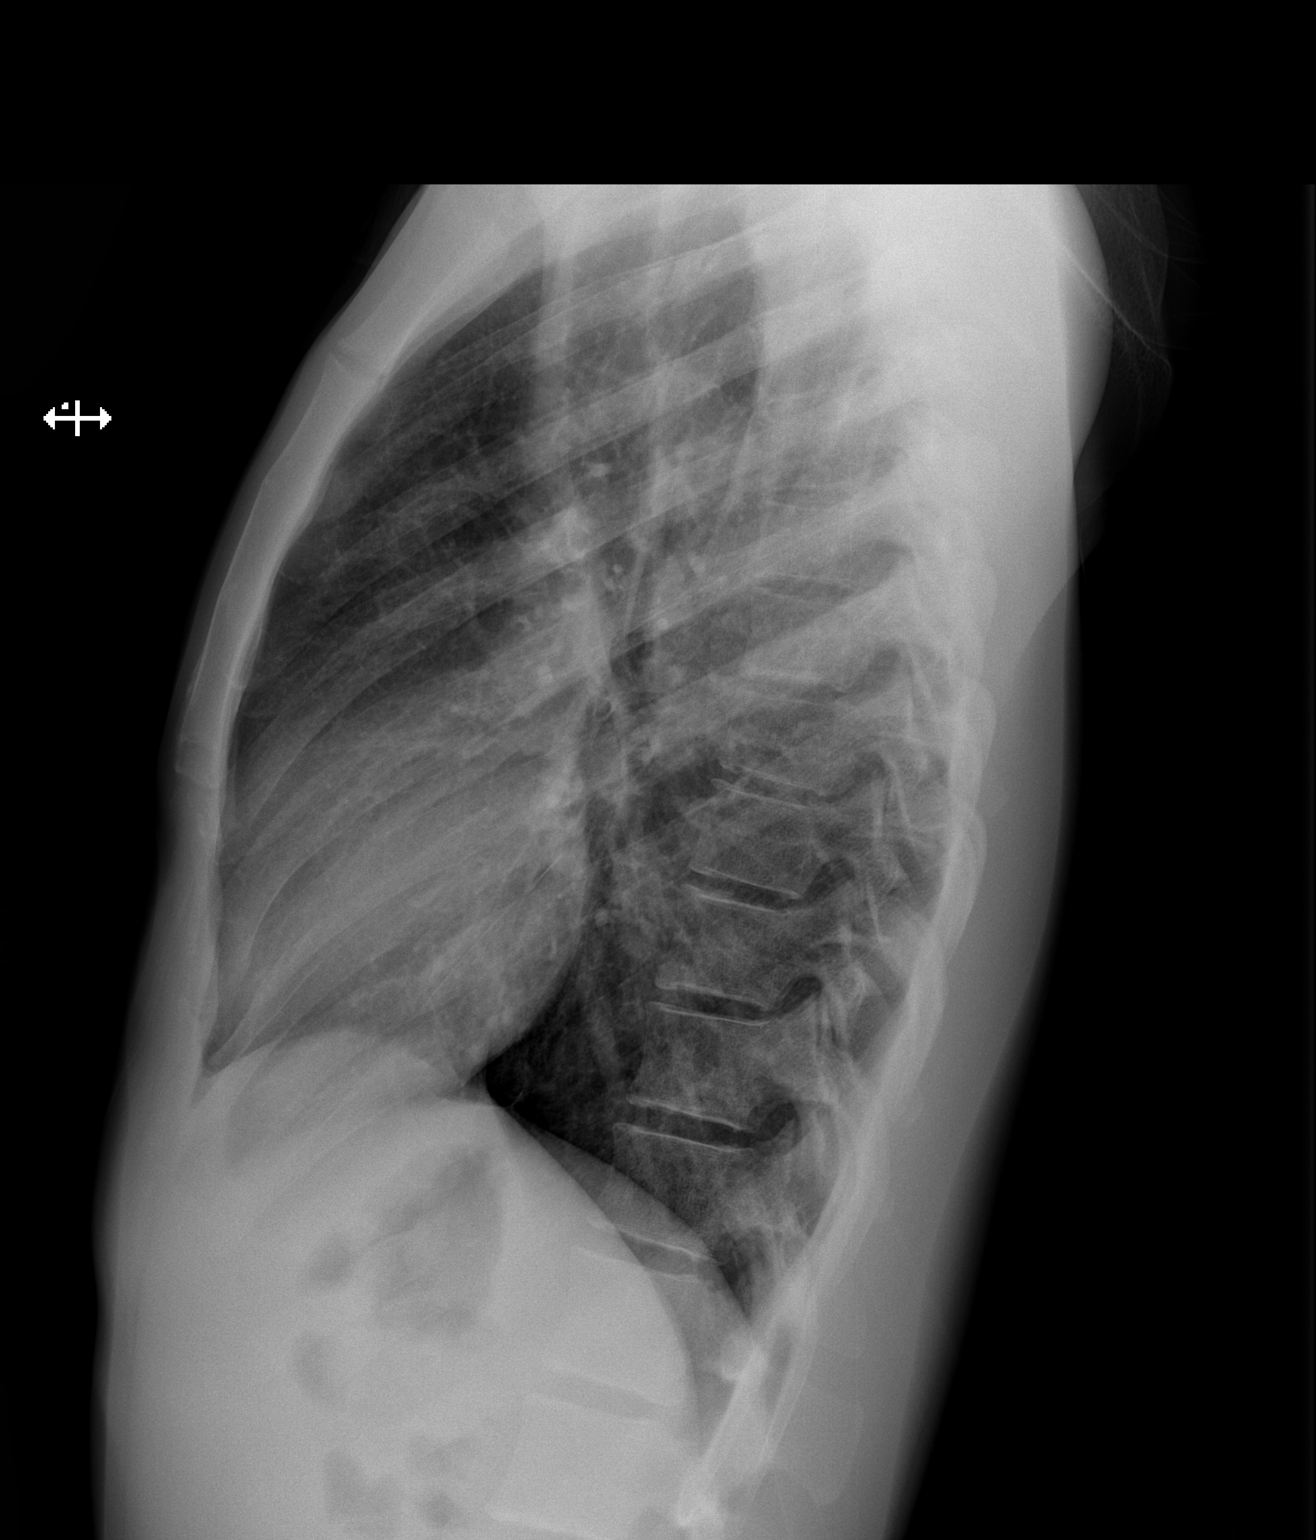

[2 of 2 positions shown; findings below may reference images not displayed]

FINDINGS: The heart size and mediastinal contours are within normal limits.
Both lungs are clear. The visualized skeletal structures are
unremarkable.
IMPRESSION: No active cardiopulmonary disease.

## 2020-05-11 ENCOUNTER — Other Ambulatory Visit: Payer: Self-pay

## 2020-06-16 ENCOUNTER — Encounter: Payer: Self-pay | Admitting: General Practice

## 2023-10-03 ENCOUNTER — Ambulatory Visit: Payer: Self-pay

## 2024-03-28 ENCOUNTER — Ambulatory Visit
Admission: EM | Admit: 2024-03-28 | Discharge: 2024-03-28 | Disposition: A | Discharge status: Home / Self Care | Payer: Self-pay | Attending: Family Medicine | Admitting: Family Medicine

## 2024-03-28 DIAGNOSIS — U071 COVID-19: Secondary | ICD-10-CM

## 2024-03-28 DIAGNOSIS — J4530 Mild persistent asthma, uncomplicated: Secondary | ICD-10-CM

## 2024-03-28 DIAGNOSIS — R509 Fever, unspecified: Secondary | ICD-10-CM

## 2024-03-28 LAB — POC COVID19/FLU A&B COMBO
Covid Antigen, POC: POSITIVE — AB
Influenza A Antigen, POC: NEGATIVE
Influenza B Antigen, POC: NEGATIVE

## 2024-03-28 MED ORDER — PREDNISONE 20 MG PO TABS: ORAL_TABLET | ORAL | 0 refills | Status: AC

## 2024-03-28 MED ORDER — PAXLOVID (300/100) 20 X 150 MG & 10 X 100MG PO TBPK: ORAL_TABLET | ORAL | 0 refills | Status: AC

## 2024-03-28 MED ORDER — PROMETHAZINE-DM 6.25-15 MG/5ML PO SYRP: 5.0000 mL | ORAL_SOLUTION | Freq: Three times a day (TID) | ORAL | 0 refills | Status: AC | PRN

## 2024-03-28 NOTE — ED Provider Notes (Signed)
 [name]  Note:  This document was prepared using Conservation officer, historic buildings and may include unintentional dictation errors.  MRN: [id number] DOB: September 22, 1990  Subjective:   Dylan Cross is a 33 y.o. male presenting for acute onset this morning of fever, body aches, chills, malaise, fatigue. No chest pain, shob, wheezing. Has asthma. Uses albuterol  prn. Vapes on occasion.   No current facility-administered medications for this encounter.  Current Outpatient Medications:    acetaminophen  (TYLENOL ) 500 MG tablet, Take 1,000 mg by mouth every 6 (six) hours as needed. Pain relief, Disp: , Rfl:    albuterol  (PROVENTIL  HFA;VENTOLIN  HFA) 108 (90 Base) MCG/ACT inhaler, Inhale 1-2 puffs into the lungs every 6 (six) hours as needed for wheezing or shortness of breath., Disp: 1 Inhaler, Rfl: 0   clindamycin  (CLEOCIN ) 150 MG capsule, Take 1 capsule (150 mg total) by mouth every 6 (six) hours., Disp: 28 capsule, Rfl: 0   cyclobenzaprine  (FLEXERIL ) 10 MG tablet, Take 1 tablet (10 mg total) by mouth 2 (two) times daily as needed for muscle spasms., Disp: 20 tablet, Rfl: 0   HYDROcodone -acetaminophen  (NORCO/VICODIN) 5-325 MG per tablet, Take 1 tablet by mouth every 4 (four) hours as needed for pain., Disp: 10 tablet, Rfl: 0   ibuprofen  (ADVIL ,MOTRIN ) 200 MG tablet, Take 800 mg by mouth every 6 (six) hours as needed. Pain., Disp: , Rfl:    ibuprofen  (ADVIL ,MOTRIN ) 800 MG tablet, Take 1 tablet (800 mg total) by mouth 3 (three) times daily., Disp: 21 tablet, Rfl: 0   naproxen  (NAPROSYN ) 500 MG tablet, Take 1 tablet (500 mg total) by mouth 2 (two) times daily with a meal., Disp: 30 tablet, Rfl: 0   Allergies  Allergen Reactions   Augmentin [Amoxicillin-Pot Clavulanate] Hives and Swelling   Penicillins     Past Medical History:  Diagnosis Date   Asthma      History reviewed. No pertinent surgical history.  No family history on file.  Social History   Tobacco Use    Smoking status: Former    Current packs/day: 0.50    Types: Cigars, Cigarettes   Smokeless tobacco: Never  Vaping Use   Vaping status: Every Day  Substance Use Topics   Alcohol use: Yes    Comment: occ   Drug use: No    ROS   Objective:   Vitals: BP 116/67 (BP Location: Right Arm)   Pulse (!) 116   Temp 100.1 F (37.8 C) (Oral)   Resp 18   SpO2 95%   Physical Exam Constitutional:      General: He is not in acute distress.    Appearance: Normal appearance. He is well-developed and normal weight. He is not ill-appearing, toxic-appearing or diaphoretic.  HENT:     Head: Normocephalic and atraumatic.     Right Ear: Tympanic membrane, ear canal and external ear normal. No drainage, swelling or tenderness. No middle ear effusion. There is no impacted cerumen. Tympanic membrane is not erythematous or bulging.     Left Ear: Tympanic membrane, ear canal and external ear normal. No drainage, swelling or tenderness.  No middle ear effusion. There is no impacted cerumen. Tympanic membrane is not erythematous or bulging.     Nose: Congestion and rhinorrhea present.     Mouth/Throat:     Mouth: Mucous membranes are moist.     Pharynx: No oropharyngeal exudate or posterior oropharyngeal erythema.  Eyes:     General: No scleral icterus.  Right eye: No discharge.        Left eye: No discharge.     Extraocular Movements: Extraocular movements intact.     Conjunctiva/sclera: Conjunctivae normal.  Cardiovascular:     Rate and Rhythm: Regular rhythm. Tachycardia present.     Heart sounds: Normal heart sounds. No murmur heard.    No friction rub. No gallop.  Pulmonary:     Effort: Pulmonary effort is normal. No respiratory distress.     Breath sounds: Normal breath sounds. No stridor. No wheezing, rhonchi or rales.  Musculoskeletal:     Cervical back: Normal range of motion and neck supple. No rigidity. No muscular tenderness.  Neurological:     General: No focal deficit  present.     Mental Status: He is alert and oriented to person, place, and time.  Psychiatric:        Mood and Affect: Mood normal.        Behavior: Behavior normal.        Thought Content: Thought content normal.    Results for orders placed or performed during the hospital encounter of 03/28/24 (from the past 24 hours)  POC Covid19/Flu A&B Antigen     Status: Abnormal   Collection Time: 03/28/24  8:50 AM  Result Value Ref Range   Influenza A Antigen, POC Negative Negative   Influenza B Antigen, POC Negative Negative   Covid Antigen, POC Positive (A) Negative    Assessment and Plan :   PDMP not reviewed this encounter.  1. COVID-19   2. Fever, unspecified   3. Mild persistent asthma, uncomplicated    Discussed the use of Paxlovid  as an antiviral.  Patient qualifies given his history of asthma.  I did recommend a prednisone  course and supportive care as well for the same reason to both treat his respiratory symptoms and underlying asthma having COVID.  Will defer imaging given clear pulmonary exam.  Counseled patient on potential for adverse effects with medications prescribed/recommended today, ER and return-to-clinic precautions discussed, patient verbalized understanding.    Dylan Cross, [place] 03/28/24 [phone]

## 2024-03-28 NOTE — ED Triage Notes (Signed)
 Pt c/o cough, body aches, chills x this am-no meds PTA-NAD-steady gait

## 2024-03-28 NOTE — Discharge Instructions (Addendum)
 We will manage this COVID 19 infection with Paxlovid . This is a COVID anti-viral medication. It can be expensive so if your insurance does not cover this, then simply rely on the following measures:  For sore throat or cough try using a honey-based tea. Use 3 teaspoons of honey with juice squeezed from half lemon. Place shaved pieces of ginger into 1/2-1 cup of water and warm over stove top. Then mix the ingredients and repeat every 4 hours as needed. Please take Tylenol  500mg -650mg  once every 6 hours for fevers, aches and pains. Hydrate very well with at least 2 liters (64 ounces) of water. Eat light meals such as soups (chicken and noodles, chicken wild rice, vegetable).  Do not eat any foods that you are allergic to.  Start an antihistamine like Zyrtec (10mg  daily) for postnasal drainage, sinus congestion.  You can take this together with prednisone  to help you with your breathing. Use albuterol  as needed.
